# Patient Record
Sex: Female | Born: 1965
Health system: Southern US, Community
[De-identification: ages and names within clinical notes are randomized; demographics above are authoritative.]

## PROBLEM LIST (undated history)

## (undated) DIAGNOSIS — F419 Anxiety disorder, unspecified: Secondary | ICD-10-CM

## (undated) DIAGNOSIS — R7303 Prediabetes: Secondary | ICD-10-CM

## (undated) DIAGNOSIS — I10 Essential (primary) hypertension: Secondary | ICD-10-CM

## (undated) HISTORY — PX: EYE SURGERY: SHX253

---

## 2005-12-19 ENCOUNTER — Ambulatory Visit: Payer: Self-pay | Admitting: Ophthalmology

## 2006-01-25 ENCOUNTER — Ambulatory Visit: Payer: Self-pay | Admitting: Ophthalmology

## 2012-03-23 ENCOUNTER — Ambulatory Visit: Payer: Self-pay | Admitting: Physician Assistant

## 2013-05-22 ENCOUNTER — Ambulatory Visit: Payer: Self-pay | Admitting: Physician Assistant

## 2014-05-28 ENCOUNTER — Ambulatory Visit: Payer: Self-pay | Admitting: Physician Assistant

## 2014-06-05 ENCOUNTER — Ambulatory Visit: Payer: Self-pay | Admitting: Physician Assistant

## 2015-03-01 ENCOUNTER — Encounter: Payer: Self-pay | Admitting: Emergency Medicine

## 2015-03-01 ENCOUNTER — Emergency Department: Payer: No Typology Code available for payment source

## 2015-03-01 DIAGNOSIS — Y998 Other external cause status: Secondary | ICD-10-CM | POA: Diagnosis not present

## 2015-03-01 DIAGNOSIS — S6992XA Unspecified injury of left wrist, hand and finger(s), initial encounter: Secondary | ICD-10-CM | POA: Diagnosis not present

## 2015-03-01 DIAGNOSIS — Z79899 Other long term (current) drug therapy: Secondary | ICD-10-CM | POA: Diagnosis not present

## 2015-03-01 DIAGNOSIS — S2001XA Contusion of right breast, initial encounter: Secondary | ICD-10-CM | POA: Diagnosis not present

## 2015-03-01 DIAGNOSIS — S299XXA Unspecified injury of thorax, initial encounter: Secondary | ICD-10-CM | POA: Diagnosis present

## 2015-03-01 DIAGNOSIS — Y9389 Activity, other specified: Secondary | ICD-10-CM | POA: Diagnosis not present

## 2015-03-01 DIAGNOSIS — Y92410 Unspecified street and highway as the place of occurrence of the external cause: Secondary | ICD-10-CM | POA: Insufficient documentation

## 2015-03-01 DIAGNOSIS — I10 Essential (primary) hypertension: Secondary | ICD-10-CM | POA: Diagnosis not present
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
## 2015-03-02 ENCOUNTER — Emergency Department
Admission: EM | Admit: 2015-03-02 | Discharge: 2015-03-02 | Disposition: A | Discharge status: Home / Self Care | Payer: No Typology Code available for payment source | Attending: Student | Admitting: Student

## 2015-03-02 DIAGNOSIS — S2001XA Contusion of right breast, initial encounter: Secondary | ICD-10-CM

## 2015-03-02 DIAGNOSIS — R0789 Other chest pain: Secondary | ICD-10-CM

## 2015-03-02 HISTORY — DX: Essential (primary) hypertension: I10

## 2015-03-02 HISTORY — DX: Anxiety disorder, unspecified: F41.9
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
## 2015-05-20 ENCOUNTER — Other Ambulatory Visit: Payer: Self-pay | Admitting: Physician Assistant

## 2015-05-20 DIAGNOSIS — Z1231 Encounter for screening mammogram for malignant neoplasm of breast: Secondary | ICD-10-CM

## 2015-05-28 ENCOUNTER — Ambulatory Visit
Admission: RE | Admit: 2015-05-28 | Discharge: 2015-05-28 | Disposition: A | Discharge status: Home / Self Care | Payer: BLUE CROSS/BLUE SHIELD | Source: Ambulatory Visit | Attending: Physician Assistant | Admitting: Physician Assistant

## 2015-05-28 DIAGNOSIS — Z1231 Encounter for screening mammogram for malignant neoplasm of breast: Secondary | ICD-10-CM | POA: Insufficient documentation

## 2016-05-18 ENCOUNTER — Other Ambulatory Visit: Payer: Self-pay | Admitting: Physician Assistant

## 2016-05-18 DIAGNOSIS — Z1231 Encounter for screening mammogram for malignant neoplasm of breast: Secondary | ICD-10-CM

## 2016-06-08 ENCOUNTER — Ambulatory Visit: Payer: No Typology Code available for payment source

## 2016-06-29 ENCOUNTER — Ambulatory Visit
Admission: RE | Admit: 2016-06-29 | Discharge: 2016-06-29 | Disposition: A | Discharge status: Home / Self Care | Payer: BLUE CROSS/BLUE SHIELD | Source: Ambulatory Visit | Attending: Physician Assistant | Admitting: Physician Assistant

## 2016-06-29 DIAGNOSIS — Z1231 Encounter for screening mammogram for malignant neoplasm of breast: Secondary | ICD-10-CM | POA: Insufficient documentation

## 2016-07-04 ENCOUNTER — Other Ambulatory Visit: Payer: Self-pay | Admitting: Physician Assistant

## 2016-07-04 DIAGNOSIS — N6489 Other specified disorders of breast: Secondary | ICD-10-CM

## 2016-07-21 ENCOUNTER — Ambulatory Visit
Admission: RE | Admit: 2016-07-21 | Discharge: 2016-07-21 | Disposition: A | Discharge status: Home / Self Care | Payer: BLUE CROSS/BLUE SHIELD | Source: Ambulatory Visit | Attending: Physician Assistant | Admitting: Physician Assistant

## 2016-07-21 DIAGNOSIS — N6489 Other specified disorders of breast: Secondary | ICD-10-CM

## 2017-05-17 DIAGNOSIS — Z0000 Encounter for general adult medical examination without abnormal findings: Secondary | ICD-10-CM | POA: Diagnosis not present

## 2017-05-17 DIAGNOSIS — I10 Essential (primary) hypertension: Secondary | ICD-10-CM | POA: Diagnosis not present

## 2017-05-24 DIAGNOSIS — Z0000 Encounter for general adult medical examination without abnormal findings: Secondary | ICD-10-CM | POA: Diagnosis not present

## 2017-05-24 DIAGNOSIS — I10 Essential (primary) hypertension: Secondary | ICD-10-CM | POA: Diagnosis not present

## 2017-06-06 DIAGNOSIS — Z719 Counseling, unspecified: Secondary | ICD-10-CM | POA: Diagnosis not present

## 2017-11-24 DIAGNOSIS — R7303 Prediabetes: Secondary | ICD-10-CM | POA: Diagnosis not present

## 2017-11-24 DIAGNOSIS — I10 Essential (primary) hypertension: Secondary | ICD-10-CM | POA: Diagnosis not present

## 2017-12-29 ENCOUNTER — Other Ambulatory Visit: Payer: Self-pay | Admitting: Physician Assistant

## 2017-12-29 DIAGNOSIS — Z1239 Encounter for other screening for malignant neoplasm of breast: Secondary | ICD-10-CM

## 2017-12-29 DIAGNOSIS — Z1231 Encounter for screening mammogram for malignant neoplasm of breast: Secondary | ICD-10-CM

## 2019-06-05 ENCOUNTER — Ambulatory Visit: Payer: Self-pay | Admitting: Psychiatry

## 2019-06-12 ENCOUNTER — Other Ambulatory Visit: Payer: Self-pay

## 2019-06-12 ENCOUNTER — Ambulatory Visit (INDEPENDENT_AMBULATORY_CARE_PROVIDER_SITE_OTHER): Payer: 59 | Admitting: Psychiatry

## 2019-06-12 ENCOUNTER — Encounter: Payer: Self-pay | Admitting: Psychiatry

## 2019-06-12 DIAGNOSIS — I10 Essential (primary) hypertension: Secondary | ICD-10-CM | POA: Insufficient documentation

## 2019-06-12 DIAGNOSIS — F419 Anxiety disorder, unspecified: Secondary | ICD-10-CM | POA: Diagnosis not present

## 2019-06-12 DIAGNOSIS — G43909 Migraine, unspecified, not intractable, without status migrainosus: Secondary | ICD-10-CM | POA: Insufficient documentation

## 2019-06-12 DIAGNOSIS — F331 Major depressive disorder, recurrent, moderate: Secondary | ICD-10-CM | POA: Diagnosis not present

## 2019-06-12 MED ORDER — BUPROPION HCL ER (XL) 150 MG PO TB24: 150.0000 mg | ORAL_TABLET | ORAL | 1 refills | Status: DC

## 2019-06-12 NOTE — Progress Notes (Signed)
Psychiatric Initial Adult Assessment   I connected with  Zoe Bastaula D Hehir on 06/12/19 by a video enabled telemedicine application and verified that I am speaking with the correct person using two identifiers.   I discussed the limitations of evaluation and management by telemedicine. The patient expressed understanding and agreed to proceed.    Patient Identification: Zoe Koch MRN:  [id number] Date of Evaluation:  06/12/2019   Referral Source:  Phil DoppM. McLaughlin, PA, [facility]  Chief Complaint:   Chief Complaint    Establish Care; Anxiety     Visit Diagnosis:    ICD-10-CM   1. Moderate episode of recurrent major depressive disorder (HCC)  F33.1 buPROPion (WELLBUTRIN XL) 150 MG 24 hr tablet  2. Anxiety  F41.9     History of Present Illness: This is a 53 year old female with history of anxiety now seen for depressive symptoms.  Patient reported being under stress due to her 51 year old son leaving their home and staying with friends since last week.  Patient reported that she is worried about him however she has a tracker on his phone during which she knows his whereabouts.  She spoke about how her 51 year old son has made impulsive decisions in the past resulting in legal implications.  She stated that her son is currently supposed to be doing community service for 2 speeding tickets.  Patient reported that she has always had low self-esteem.  Her excessive weight.  She spoke about being in a relationship more than 20 years ago in which her partner's ex committed suicide.  Patient stated that she feels about that.  She also reported being in several unhealthy relationships in the past.  He stated that she always ends a in constrained relationships due to her poor choice of partners.  Patient stated that she is able to function at work, works as a Community education officercar salesman.  She feels her work is like a good Therapist, artdistraction.  However at home she feels depressed and tearful.  She cries easily and  frequently.  She has hard time focusing on tasks at home.  She is able to fall asleep however wakes up early in the morning.  She is trying hard to keep her appetite under control as she wants to lose weight.  She has had thoughts of why she is alive because her mind however she has never thought of hurting herself.  She denied any suicide attempts in the past.  She denied any manic or psychotic symptoms.  She has been on phentermine on and off for weight loss for last few years which she feels has helped some but not significantly.  She has been prescribed Xanax by her PCP for the past few years which she finds to be helpful.  She usually takes 1 tablet/day sometimes needs food and 1 tablet.  She is never taken any other medications for depression or anxiety. She denied excessive consumption of alcohol or use of illicit drugs.  Patient reported having friends that are supportive.  Associated Signs/Symptoms: Depression Symptoms:  See HPI (Hypo) Manic Symptoms:  denied Anxiety Symptoms:  See HPI Psychotic Symptoms:  denied PTSD Symptoms: Negative  Past Psychiatric History: Anxiety  Previous Psychotropic Medications: Yes   Substance Abuse History in the last 12 months:  No.  Consequences of Substance Abuse: Negative  Past Medical History:  Past Medical History:  Diagnosis Date  . Anxiety   . Hypertension     Past Surgical History:  Procedure Laterality Date  . EYE SURGERY  Family Psychiatric History: denied  Family History: History reviewed. No pertinent family history.  Social History:   Social History   Socioeconomic History  . Marital status: Single    Spouse name: Not on file  . Number of children: 1  . Years of education: Not on file  . Highest education level: Bachelor's degree (e.g., BA, AB, BS)  Occupational History  . Not on file  Social Needs  . Financial resource strain: Not hard at all  . Food insecurity    Worry: Never true    Inability: Never  true  . Transportation needs    Medical: No    Non-medical: No  Tobacco Use  . Smoking status: Never Smoker  . Smokeless tobacco: Never Used  Substance and Sexual Activity  . Alcohol use: Yes    Comment: occasional  . Drug use: No  . Sexual activity: Not Currently  Lifestyle  . Physical activity    Days per week: 0 days    Minutes per session: 0 min  . Stress: Not on file  Relationships  . Social Musician on phone: Not on file    Gets together: Not on file    Attends religious service: Never    Active member of club or organization: No    Attends meetings of clubs or organizations: Never    Relationship status: Never married  Other Topics Concern  . Not on file  Social History Narrative  . 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  Allergies:   Allergies  Allergen Reactions  . Sulfa Antibiotics Hives    Metabolic Disorder Labs: No results found for: HGBA1C, MPG No results found for: PROLACTIN No results found for: CHOL, TRIG, HDL, CHOLHDL, VLDL, LDLCALC No results found for: TSH  Therapeutic Level Labs: No results found for: LITHIUM No results found for: CBMZ No results found for: VALPROATE  Current Medications: Current Outpatient Medications  Medication Sig Dispense Refill  . ALPRAZolam (XANAX) 0.25 MG tablet Take 0.25 mg by mouth every morning.    . Cholecalciferol (VITAMIN D3 PO) Take by mouth.    . losartan-hydrochlorothiazide (HYZAAR) 100-25 MG per tablet Take 1 tablet by mouth daily.    . Magnesium 200 MG TABS Take by mouth.    . metFORMIN (GLUCOPHAGE-XR) 500 MG 24 hr tablet TAKE 1 TABLET(500 MG) BY MOUTH DAILY WITH DINNER    . multivitamin-iron-minerals-folic acid (CENTRUM) chewable tablet Chew 1 tablet by mouth daily.    . phentermine 37.5 MG capsule Take 37.5 mg by mouth every morning.    . Probiotic Product (SM ACIDOPHILUS) CAPS Take by mouth.    Marland Kitchen buPROPion (WELLBUTRIN XL) 150 MG 24 hr tablet Take  1 tablet (150 mg total) by mouth every morning. 30 tablet 1   No current facility-administered medications for this visit.     Musculoskeletal: Strength & Muscle Tone: unable to assess due to telemed visit Gait & Station: unable to assess due to telemed visit Patient leans: unable to assess due to telemed visit  Psychiatric Specialty Exam: ROS  There were no vitals taken for this visit.There is no height or weight on file to calculate BMI.  General Appearance: Fairly Groomed  Eye Contact:  Good  Speech:  Clear and Coherent and Normal Rate  Volume:  Normal  Mood:  Depressed  Affect:  Depressed and Tearful  Thought Process:  Goal Directed, Linear and Descriptions of Associations: Intact  Orientation:  Full (Time, Place, and Person)  Thought Content:  Logical  Suicidal Thoughts:  No  Homicidal Thoughts:  No  Memory:  Recent;   Good Remote;   Good  Judgement:  Fair  Insight:  Fair  Psychomotor Activity:  Normal  Concentration:  Concentration: Good and Attention Span: Good  Recall:  Good  Fund of Knowledge:Good  Language: Good  Akathisia:  Negative  Handed:  Right  AIMS (if indicated):  Not done  Assets:  Communication Skills Desire for Improvement Financial Resources/Insurance Social Support Talents/Skills Transportation Vocational/Educational  ADL's:  Intact  Cognition: WNL  Sleep:  Fair   Assessment and Plan: 53 year old female seen for depressive symptoms and anxiety in the context of low self-esteem and stress due to 60 year old son.  She is never taken any antidepressants and is willing to try Wellbutrin for depressive symptoms.  She would like to continue as needed Xanax for anxiety.  1. Moderate episode of recurrent major depressive disorder (HCC)  - Start buPROPion (WELLBUTRIN XL) 150 MG 24 hr tablet; Take 1 tablet (150 mg total) by mouth every morning.  Dispense: 30 tablet; Refill: 1  2. Anxiety - Continue as needed Xanax.  Follow-up in 6  weeks.   [place] Crane, MD 10/28/20201:18 PM

## 2019-07-25 ENCOUNTER — Ambulatory Visit (INDEPENDENT_AMBULATORY_CARE_PROVIDER_SITE_OTHER): Payer: 59 | Admitting: Psychiatry

## 2019-07-25 ENCOUNTER — Other Ambulatory Visit: Payer: Self-pay

## 2019-07-25 ENCOUNTER — Encounter: Payer: Self-pay | Admitting: Psychiatry

## 2019-07-25 DIAGNOSIS — F3342 Major depressive disorder, recurrent, in full remission: Secondary | ICD-10-CM

## 2019-07-25 DIAGNOSIS — F331 Major depressive disorder, recurrent, moderate: Secondary | ICD-10-CM | POA: Diagnosis not present

## 2019-07-25 DIAGNOSIS — F419 Anxiety disorder, unspecified: Secondary | ICD-10-CM

## 2019-07-25 MED ORDER — BUPROPION HCL ER (XL) 150 MG PO TB24: 150.0000 mg | ORAL_TABLET | ORAL | 1 refills | Status: DC

## 2019-07-25 NOTE — Progress Notes (Signed)
BH MD OP Progress Note  I connected with  Zoe Koch on 07/25/19 by a video enabled telemedicine application and verified that I am speaking with the correct person using two identifiers.   I discussed the limitations of evaluation and management by telemedicine. The patient expressed understanding and agreed to proceed.   07/25/2019 8:35 AM Zoe Koch  MRN:  [id number]  Chief Complaint: " I am doing well."  HPI: Patient reported that she has been doing well since she started taking Wellbutrin.  She feels it has helped her depressive symptoms and energy levels.  She feels more productive at work.  Overall she is doing better.  She informed that her son is still not at home.  He did not even come home for Thanksgiving.  They have been in touch over the phone.  She is worried that her son is not doing any of her schoolwork and attending the online classes.  He asked decided to start working full-time.  She just wants him to make sensible decisions and stay away from trouble. She denied any issues or concerns at this time.  Visit Diagnosis:    ICD-10-CM   1. MDD (major depressive disorder), recurrent, in full remission (HCC)  F33.42   2. Anxiety  F41.9     Past Psychiatric History: Depression, anxiety  Past Medical History:  Past Medical History:  Diagnosis Date  . Anxiety   . Hypertension     Past Surgical History:  Procedure Laterality Date  . EYE SURGERY      Family Psychiatric History: Denied  Family History: No family history on file.  Social History:  Social History   Socioeconomic History  . Marital status: Single    Spouse name: Not on file  . Number of children: 1  . Years of education: Not on file  . Highest education level: Bachelor's degree (e.g., BA, AB, BS)  Occupational History  . Not on file  Tobacco Use  . Smoking status: Never Smoker  . Smokeless tobacco: Never Used  Substance and Sexual Activity  . Alcohol use: Yes    Comment: occasional   . Drug use: No  . Sexual activity: Not Currently  Other Topics Concern  . Not on file  Social History Narrative  . Not on file   Social Determinants of Health   Financial Resource Strain: Low Risk   . Difficulty of Paying Living Expenses: Not hard at all  Food Insecurity: No Food Insecurity  . Worried About Programme researcher, broadcasting/film/video in the Last Year: Never true  . Ran Out of Food in the Last Year: Never true  Transportation Needs: No Transportation Needs  . Lack of Transportation (Medical): No  . Lack of Transportation (Non-Medical): No  Physical Activity: Inactive  . Days of Exercise per Week: 0 days  . Minutes of Exercise per Session: 0 min  Stress:   . Feeling of Stress : Not on file  Social Connections: Unknown  . Frequency of Communication with Friends and Family: Not on file  . Frequency of Social Gatherings with Friends and Family: Not on file  . Attends Religious Services: Never  . Active Member of Clubs or Organizations: No  . Attends Banker Meetings: Never  . Marital Status: Never married    Allergies:  Allergies  Allergen Reactions  . Sulfa Antibiotics Hives    Metabolic Disorder Labs: No results found for: HGBA1C, MPG No results found for: PROLACTIN No results found for: CHOL,  TRIG, HDL, CHOLHDL, VLDL, LDLCALC No results found for: TSH  Therapeutic Level Labs: No results found for: LITHIUM No results found for: VALPROATE No components found for:  CBMZ  Current Medications: Current Outpatient Medications  Medication Sig Dispense Refill  . ALPRAZolam (XANAX) 0.25 MG tablet Take 0.25 mg by mouth every morning.    Marland Kitchen buPROPion (WELLBUTRIN XL) 150 MG 24 hr tablet Take 1 tablet (150 mg total) by mouth every morning. 30 tablet 1  . Cholecalciferol (VITAMIN D3 PO) Take by mouth.    . losartan-hydrochlorothiazide (HYZAAR) 100-25 MG per tablet Take 1 tablet by mouth daily.    . Magnesium 200 MG TABS Take by mouth.    . metFORMIN (GLUCOPHAGE-XR) 500  MG 24 hr tablet TAKE 1 TABLET(500 MG) BY MOUTH DAILY WITH DINNER    . multivitamin-iron-minerals-folic acid (CENTRUM) chewable tablet Chew 1 tablet by mouth daily.    . phentermine 37.5 MG capsule Take 37.5 mg by mouth every morning.    . Probiotic Product (SM ACIDOPHILUS) CAPS Take by mouth.     No current facility-administered medications for this visit.     Musculoskeletal: Strength & Muscle Tone: unable to assess due to telemed visit Gait & Station: unable to assess due to telemed visit Patient leans: unable to assess due to telemed visit   Psychiatric Specialty Exam: Review of Systems  There were no vitals taken for this visit.There is no height or weight on file to calculate BMI.  General Appearance: Well Groomed  Eye Contact:  Good  Speech:  Clear and Coherent and Normal Rate  Volume:  Normal  Mood:  Euthymic  Affect:  Congruent  Thought Process:  Goal Directed, Linear and Descriptions of Associations: Intact  Orientation:  Full (Time, Place, and Person)  Thought Content: Logical   Suicidal Thoughts:  No  Homicidal Thoughts:  No  Memory:  Recent;   Good Remote;   Good  Judgement:  Good  Insight:  Good  Psychomotor Activity:  Normal  Concentration:  Concentration: Good and Attention Span: Good  Recall:  Good  Fund of Knowledge: Good  Language: Good  Akathisia:  Negative  Handed:  Right  AIMS (if indicated): not done  Assets:  Communication Skills Desire for Improvement Financial Resources/Insurance Housing Social Support  ADL's:  Intact  Cognition: WNL  Sleep:  Good     Assessment and Plan: Patient reported doing well on Wellbutrin XL 150 mg every morning.  She still worried about her son but is trying to support him as much as she can.  1. MDD (major depressive disorder), recurrent, in full remission ([place])  - buPROPion (WELLBUTRIN XL) 150 MG 24 hr tablet; Take 1 tablet (150 mg total) by mouth every morning.  Dispense: 30 tablet; Refill: 1  2.  Anxiety - Continue PRN Xanax.  F/up in 2 months.     [place] Crane, MD 07/25/2019, 8:35 AM

## 2019-09-19 ENCOUNTER — Ambulatory Visit (INDEPENDENT_AMBULATORY_CARE_PROVIDER_SITE_OTHER): Payer: 59 | Admitting: Psychology

## 2019-09-19 DIAGNOSIS — F411 Generalized anxiety disorder: Secondary | ICD-10-CM | POA: Diagnosis not present

## 2019-09-26 ENCOUNTER — Ambulatory Visit (INDEPENDENT_AMBULATORY_CARE_PROVIDER_SITE_OTHER): Payer: 59 | Admitting: Psychology

## 2019-09-26 DIAGNOSIS — F411 Generalized anxiety disorder: Secondary | ICD-10-CM

## 2019-09-27 ENCOUNTER — Ambulatory Visit (INDEPENDENT_AMBULATORY_CARE_PROVIDER_SITE_OTHER): Payer: 59 | Admitting: Psychiatry

## 2019-09-27 ENCOUNTER — Other Ambulatory Visit: Payer: Self-pay

## 2019-09-27 ENCOUNTER — Encounter: Payer: Self-pay | Admitting: Psychiatry

## 2019-09-27 DIAGNOSIS — F3342 Major depressive disorder, recurrent, in full remission: Secondary | ICD-10-CM | POA: Diagnosis not present

## 2019-09-27 DIAGNOSIS — F419 Anxiety disorder, unspecified: Secondary | ICD-10-CM | POA: Diagnosis not present

## 2019-09-27 MED ORDER — BUPROPION HCL ER (XL) 150 MG PO TB24: 150.0000 mg | ORAL_TABLET | ORAL | 1 refills | Status: DC

## 2019-09-27 NOTE — Progress Notes (Signed)
[facility] MD OP Progress Note  I connected with  Zoe Koch on 09/27/19 by a video enabled telemedicine application and verified that I am speaking with the correct person using two identifiers.   I discussed the limitations of evaluation and management by telemedicine. The patient expressed understanding and agreed to proceed.   09/27/2019 8:35 AM Zoe Koch  MRN:  [id number]  Chief Complaint: " I am doing well."  HPI: Patient reported that she is doing well. She has started psychotherapy and has seen the therapist for 2 sessions recently.  She feels that she is on the right track for now.  She informed that her son came back home right for Christmas and has been living with her since then.  It has been a big relief to have been at home for her.  She informed that he is still not attending his online virtual classes.  He has only 1 more class left and if he completes the credits for that he would be able to graduate high school in summer.  She stated that she really wants him to have a high school diploma.  He is working more than 40 hours/week currently and is being responsible with his own finances. She has been sleeping well and denied any other acute issues or concerns.   Visit Diagnosis:    ICD-10-CM   1. MDD (major depressive disorder), recurrent, in full remission ([place])  F33.42   2. Anxiety  F41.9     Past Psychiatric History: Depression, anxiety  Past Medical History:  Past Medical History:  Diagnosis Date  . Anxiety   . Hypertension     Past Surgical History:  Procedure Laterality Date  . EYE SURGERY      Family Psychiatric History: Denied  Family History: No family history on file.  Social History:  Social History   Socioeconomic History  . Marital status: Single    Spouse name: Not on file  . Number of children: 1  . Years of education: Not on file  . Highest education level: Bachelor's degree (e.g., BA, AB, BS)  Occupational History  . Not on file   Tobacco Use  . Smoking status: Never Smoker  . Smokeless tobacco: Never Used  Substance and Sexual Activity  . Alcohol use: Yes    Comment: occasional  . Drug use: No  . Sexual activity: Not Currently  Other Topics Concern  . Not on file  Social History Narrative  . Not on file   Social Determinants of Health   Financial Resource Strain: Low Risk   . Difficulty of Paying Living Expenses: Not hard at all  Food Insecurity: No Food Insecurity  . Worried About Charity fundraiser in the Last Year: Never true  . Ran Out of Food in the Last Year: Never true  Transportation Needs: No Transportation Needs  . Lack of Transportation (Medical): No  . Lack of Transportation (Non-Medical): No  Physical Activity: Inactive  . Days of Exercise per Week: 0 days  . Minutes of Exercise per Session: 0 min  Stress:   . Feeling of Stress : Not on file  Social Connections: Unknown  . Frequency of Communication with Friends and Family: Not on file  . Frequency of Social Gatherings with Friends and Family: Not on file  . Attends Religious Services: Never  . Active Member of Clubs or Organizations: No  . Attends Archivist Meetings: Never  . Marital Status: Never married  Allergies:  Allergies  Allergen Reactions  . Sulfa Antibiotics Hives    Metabolic Disorder Labs: No results found for: HGBA1C, MPG No results found for: PROLACTIN No results found for: CHOL, TRIG, HDL, CHOLHDL, VLDL, LDLCALC No results found for: TSH  Therapeutic Level Labs: No results found for: LITHIUM No results found for: VALPROATE No components found for:  CBMZ  Current Medications: Current Outpatient Medications  Medication Sig Dispense Refill  . ALPRAZolam (XANAX) 0.25 MG tablet Take 0.25 mg by mouth every morning.    Marland Kitchen buPROPion (WELLBUTRIN XL) 150 MG 24 hr tablet Take 1 tablet (150 mg total) by mouth every morning. 30 tablet 1  . Cholecalciferol (VITAMIN D3 PO) Take by mouth.    .  losartan-hydrochlorothiazide (HYZAAR) 100-25 MG per tablet Take 1 tablet by mouth daily.    . Magnesium 200 MG TABS Take by mouth.    . metFORMIN (GLUCOPHAGE-XR) 500 MG 24 hr tablet TAKE 1 TABLET(500 MG) BY MOUTH DAILY WITH DINNER    . multivitamin-iron-minerals-folic acid (CENTRUM) chewable tablet Chew 1 tablet by mouth daily.    . phentermine 37.5 MG capsule Take 37.5 mg by mouth every morning.    . Probiotic Product (SM ACIDOPHILUS) CAPS Take by mouth.     No current facility-administered medications for this visit.     Musculoskeletal: Strength & Muscle Tone: unable to assess due to telemed visit Gait & Station: unable to assess due to telemed visit Patient leans: unable to assess due to telemed visit   Psychiatric Specialty Exam: Review of Systems  There were no vitals taken for this visit.There is no height or weight on file to calculate BMI.  General Appearance: Fairly Groomed  Eye Contact:  Good  Speech:  Clear and Coherent and Normal Rate  Volume:  Normal  Mood:  Euthymic  Affect:  Congruent  Thought Process:  Goal Directed, Linear and Descriptions of Associations: Intact  Orientation:  Full (Time, Place, and Person)  Thought Content: Logical   Suicidal Thoughts:  No  Homicidal Thoughts:  No  Memory:  Recent;   Good Remote;   Good  Judgement:  Good  Insight:  Good  Psychomotor Activity:  Normal  Concentration:  Concentration: Good and Attention Span: Good  Recall:  Good  Fund of Knowledge: Good  Language: Good  Akathisia:  Negative  Handed:  Right  AIMS (if indicated): not done  Assets:  Communication Skills Desire for Improvement Financial Resources/Insurance Housing Social Support  ADL's:  Intact  Cognition: WNL  Sleep:  Good     Assessment and Plan: Patient reported doing well on current regimen.  1. MDD (major depressive disorder), recurrent, in full remission ([facility])  -Continue buPROPion (WELLBUTRIN XL) 150 MG 24 hr tablet; Take 1 tablet (150 mg  total) by mouth every morning.  Dispense: 30 tablet; Refill: 1  2. Anxiety - Continue PRN Xanax.  F/up in 3 months.     Zena Amos, MD 09/27/2019, 8:35 AM

## 2019-10-03 ENCOUNTER — Ambulatory Visit: Payer: 59 | Admitting: Psychology

## 2019-10-10 ENCOUNTER — Ambulatory Visit (INDEPENDENT_AMBULATORY_CARE_PROVIDER_SITE_OTHER): Payer: 59 | Admitting: Psychology

## 2019-10-10 DIAGNOSIS — F411 Generalized anxiety disorder: Secondary | ICD-10-CM | POA: Diagnosis not present

## 2019-11-12 ENCOUNTER — Other Ambulatory Visit: Payer: Self-pay | Admitting: Physician Assistant

## 2019-11-12 DIAGNOSIS — N6489 Other specified disorders of breast: Secondary | ICD-10-CM

## 2019-11-27 ENCOUNTER — Ambulatory Visit
Admission: RE | Admit: 2019-11-27 | Discharge: 2019-11-27 | Disposition: A | Discharge status: Home / Self Care | Payer: 59 | Source: Ambulatory Visit | Attending: Physician Assistant | Admitting: Physician Assistant

## 2019-11-27 ENCOUNTER — Ambulatory Visit (INDEPENDENT_AMBULATORY_CARE_PROVIDER_SITE_OTHER): Payer: 59 | Admitting: Psychology

## 2019-11-27 DIAGNOSIS — N6489 Other specified disorders of breast: Secondary | ICD-10-CM

## 2019-11-27 DIAGNOSIS — F411 Generalized anxiety disorder: Secondary | ICD-10-CM | POA: Diagnosis not present

## 2019-12-19 ENCOUNTER — Telehealth (INDEPENDENT_AMBULATORY_CARE_PROVIDER_SITE_OTHER): Payer: 59 | Admitting: Psychiatry

## 2019-12-19 ENCOUNTER — Encounter: Payer: Self-pay | Admitting: Psychiatry

## 2019-12-19 ENCOUNTER — Other Ambulatory Visit: Payer: Self-pay

## 2019-12-19 DIAGNOSIS — F3342 Major depressive disorder, recurrent, in full remission: Secondary | ICD-10-CM

## 2019-12-19 DIAGNOSIS — F419 Anxiety disorder, unspecified: Secondary | ICD-10-CM | POA: Diagnosis not present

## 2019-12-19 MED ORDER — BUPROPION HCL ER (XL) 150 MG PO TB24: 150.0000 mg | ORAL_TABLET | ORAL | 1 refills | Status: DC

## 2019-12-19 NOTE — Progress Notes (Signed)
[facility] MD OP Progress Note  I connected with  Zoe Koch on 12/19/19 by a video enabled telemedicine application and verified that I am speaking with the correct person using two identifiers.   I discussed the limitations of evaluation and management by telemedicine. The patient expressed understanding and agreed to proceed.   12/19/2019 8:54 AM Zoe Koch  MRN:  [id number]  Chief Complaint: " I am doing well."  HPI: Patient reported that she is doing well. She informed her mood has been great with help of Wellbutrin. She informed that her adoptive son is doing very well too. He has been attending his classes and is going to graduate form high school end of this month. He is still working full time. Pt stated that she is very relieved of the stress she had to face with her son last year. She denied any acute issues or concerns at this time.   Visit Diagnosis:    ICD-10-CM   1. MDD (major depressive disorder), recurrent, in full remission ([place])  F33.42   2. Anxiety  F41.9     Past Psychiatric History: Depression, anxiety  Past Medical History:  Past Medical History:  Diagnosis Date  . Anxiety   . Hypertension     Past Surgical History:  Procedure Laterality Date  . EYE SURGERY      Family Psychiatric History: Denied  Family History:  Family History  Problem Relation Age of Onset  . Breast cancer Neg Hx     Social History:  Social History   Socioeconomic History  . Marital status: Single    Spouse name: Not on file  . Number of children: 1  . Years of education: Not on file  . Highest education level: Bachelor's degree (e.g., BA, AB, BS)  Occupational History  . Not on file  Tobacco Use  . Smoking status: Never Smoker  . Smokeless tobacco: Never Used  Substance and Sexual Activity  . Alcohol use: Yes    Comment: occasional  . Drug use: No  . Sexual activity: Not Currently  Other Topics Concern  . Not on file  Social History Narrative  . Not on file    Social Determinants of Health   Financial Resource Strain: Low Risk   . Difficulty of Paying Living Expenses: Not hard at all  Food Insecurity: No Food Insecurity  . Worried About Charity fundraiser in the Last Year: Never true  . Ran Out of Food in the Last Year: Never true  Transportation Needs: No Transportation Needs  . Lack of Transportation (Medical): No  . Lack of Transportation (Non-Medical): No  Physical Activity: Inactive  . Days of Exercise per Week: 0 days  . Minutes of Exercise per Session: 0 min  Stress:   . Feeling of Stress :   Social Connections: Unknown  . Frequency of Communication with Friends and Family: Not on file  . Frequency of Social Gatherings with Friends and Family: Not on file  . Attends Religious Services: Never  . Active Member of Clubs or Organizations: No  . Attends Archivist Meetings: Never  . Marital Status: Never married    Allergies:  Allergies  Allergen Reactions  . Sulfa Antibiotics Hives    Metabolic Disorder Labs: No results found for: HGBA1C, MPG No results found for: PROLACTIN No results found for: CHOL, TRIG, HDL, CHOLHDL, VLDL, LDLCALC No results found for: TSH  Therapeutic Level Labs: No results found for: LITHIUM No results found for:  VALPROATE No components found for:  CBMZ  Current Medications: Current Outpatient Medications  Medication Sig Dispense Refill  . ALPRAZolam (XANAX) 0.25 MG tablet Take 0.25 mg by mouth every morning.    Marland Kitchen buPROPion (WELLBUTRIN XL) 150 MG 24 hr tablet Take 1 tablet (150 mg total) by mouth every morning. 90 tablet 1  . Cholecalciferol (VITAMIN D3 PO) Take by mouth.    . losartan-hydrochlorothiazide (HYZAAR) 100-25 MG per tablet Take 1 tablet by mouth daily.    . Magnesium 200 MG TABS Take by mouth.    . metFORMIN (GLUCOPHAGE-XR) 500 MG 24 hr tablet TAKE 1 TABLET(500 MG) BY MOUTH DAILY WITH DINNER    . multivitamin-iron-minerals-folic acid (CENTRUM) chewable tablet Chew 1  tablet by mouth daily.    . phentermine 37.5 MG capsule Take 37.5 mg by mouth every morning.    . Probiotic Product (SM ACIDOPHILUS) CAPS Take by mouth.     No current facility-administered medications for this visit.     Musculoskeletal: Strength & Muscle Tone: unable to assess due to telemed visit Gait & Station: unable to assess due to telemed visit Patient leans: unable to assess due to telemed visit   Psychiatric Specialty Exam: Review of Systems  Last menstrual period 07/15/2016.There is no height or weight on file to calculate BMI.  General Appearance: Well Groomed  Eye Contact:  Good  Speech:  Clear and Coherent and Normal Rate  Volume:  Normal  Mood:  Euthymic  Affect:  Congruent  Thought Process:  Goal Directed, Linear and Descriptions of Associations: Intact  Orientation:  Full (Time, Place, and Person)  Thought Content: Logical   Suicidal Thoughts:  No  Homicidal Thoughts:  No  Memory:  Recent;   Good Remote;   Good  Judgement:  Good  Insight:  Good  Psychomotor Activity:  Normal  Concentration:  Concentration: Good and Attention Span: Good  Recall:  Good  Fund of Knowledge: Good  Language: Good  Akathisia:  Negative  Handed:  Right  AIMS (if indicated): not done  Assets:  Communication Skills Desire for Improvement Financial Resources/Insurance Housing Social Support  ADL's:  Intact  Cognition: WNL  Sleep:  Good     Assessment and Plan: Patient reported doing well on current regimen.  1. MDD (major depressive disorder), recurrent, in full remission ([facility])  -Continue buPROPion (WELLBUTRIN XL) 150 MG 24 hr tablet; Take 1 tablet (150 mg total) by mouth every morning.  Dispense: 30 tablet; Refill: 1  2. Anxiety - Continue PRN Xanax.  F/up in 3 months.     Zena Amos, MD 12/19/2019, 8:54 AM

## 2020-01-02 ENCOUNTER — Ambulatory Visit: Payer: 59 | Admitting: Psychology

## 2020-01-29 ENCOUNTER — Ambulatory Visit (INDEPENDENT_AMBULATORY_CARE_PROVIDER_SITE_OTHER): Payer: 59 | Admitting: Psychology

## 2020-01-29 DIAGNOSIS — F411 Generalized anxiety disorder: Secondary | ICD-10-CM

## 2020-02-13 ENCOUNTER — Telehealth: Payer: 59 | Admitting: Psychiatry

## 2020-02-13 ENCOUNTER — Other Ambulatory Visit: Payer: Self-pay

## 2020-02-13 ENCOUNTER — Encounter (HOSPITAL_COMMUNITY): Payer: Self-pay | Admitting: Psychiatry

## 2020-02-13 ENCOUNTER — Telehealth (INDEPENDENT_AMBULATORY_CARE_PROVIDER_SITE_OTHER): Payer: 59 | Admitting: Psychiatry

## 2020-02-13 DIAGNOSIS — F3342 Major depressive disorder, recurrent, in full remission: Secondary | ICD-10-CM | POA: Diagnosis not present

## 2020-02-13 DIAGNOSIS — F419 Anxiety disorder, unspecified: Secondary | ICD-10-CM

## 2020-02-13 MED ORDER — BUPROPION HCL ER (XL) 150 MG PO TB24: 150.0000 mg | ORAL_TABLET | ORAL | 1 refills | Status: AC

## 2020-02-13 NOTE — Progress Notes (Signed)
[facility] OP Progress Note  Virtual Visit via Video Note  I connected with Zoe Koch on 02/13/20 at  8:30 AM EDT by a video enabled telemedicine application and verified that I am speaking with the correct person using two identifiers.  Location: Patient: Home Provider: Clinic   I discussed the limitations of evaluation and management by telemedicine and the availability of in person appointments. The patient expressed understanding and agreed to proceed.  I provided 13 minutes of non-face-to-face time during this encounter.     02/13/2020 8:45 AM THOMASINA HOUSLEY  MRN:  [id number]  Chief Complaint: " Everything is going well."  HPI: Patient reported things going well.  She informed that her son graduate from high school and she is very happy about that.  She stated that she feels very relieved and feels like she deserves as she put a lot of effort in making sure he graduates from high school. She informed that he still living with her and has a full-time job now.  She informed that she is doing well at her work and everything is progressing well for them.  She denies any acute issues or concerns at this time.  Visit Diagnosis:    ICD-10-CM   1. MDD (major depressive disorder), recurrent, in full remission ([facility])  F33.42   2. Anxiety  F41.9     Past Psychiatric History: Depression, anxiety  Past Medical History:  Past Medical History:  Diagnosis Date  . Anxiety   . Hypertension     Past Surgical History:  Procedure Laterality Date  . EYE SURGERY      Family Psychiatric History: Denied  Family History:  Family History  Problem Relation Age of Onset  . Breast cancer Neg Hx     Social History:  Social History   Socioeconomic History  . Marital status: Single    Spouse name: Not on file  . Number of children: 1  . Years of education: Not on file  . Highest education level: Bachelor's degree (e.g., BA, AB, BS)  Occupational History  . Not on file  Tobacco Use  .  Smoking status: Never Smoker  . Smokeless tobacco: Never Used  Vaping Use  . Vaping Use: Never used  Substance and Sexual Activity  . Alcohol use: Yes    Comment: occasional  . Drug use: No  . Sexual activity: Not Currently  Other Topics Concern  . Not on file  Social History Narrative  . Not on file   Social Determinants of Health   Financial Resource Strain: Low Risk   . Difficulty of Paying Living Expenses: Not hard at all  Food Insecurity: No Food Insecurity  . Worried About Programme researcher, broadcasting/film/video in the Last Year: Never true  . Ran Out of Food in the Last Year: Never true  Transportation Needs: No Transportation Needs  . Lack of Transportation (Medical): No  . Lack of Transportation (Non-Medical): No  Physical Activity: Inactive  . Days of Exercise per Week: 0 days  . Minutes of Exercise per Session: 0 min  Stress:   . Feeling of Stress :   Social Connections: Unknown  . Frequency of Communication with Friends and Family: Not on file  . Frequency of Social Gatherings with Friends and Family: Not on file  . Attends Religious Services: Never  . Active Member of Clubs or Organizations: No  . Attends Banker Meetings: Never  . Marital Status: Never married    Allergies:  Allergies  Allergen Reactions  . Sulfa Antibiotics Hives    Metabolic Disorder Labs: No results found for: HGBA1C, MPG No results found for: PROLACTIN No results found for: CHOL, TRIG, HDL, CHOLHDL, VLDL, LDLCALC No results found for: TSH  Therapeutic Level Labs: No results found for: LITHIUM No results found for: VALPROATE No components found for:  CBMZ  Current Medications: Current Outpatient Medications  Medication Sig Dispense Refill  . ALPRAZolam (XANAX) 0.25 MG tablet Take 0.25 mg by mouth every morning.    Marland Kitchen buPROPion (WELLBUTRIN XL) 150 MG 24 hr tablet Take 1 tablet (150 mg total) by mouth every morning. 90 tablet 1  . Cholecalciferol (VITAMIN D3 PO) Take by mouth.    .  losartan-hydrochlorothiazide (HYZAAR) 100-25 MG per tablet Take 1 tablet by mouth daily.    . Magnesium 200 MG TABS Take by mouth.    . metFORMIN (GLUCOPHAGE-XR) 500 MG 24 hr tablet TAKE 1 TABLET(500 MG) BY MOUTH DAILY WITH DINNER    . multivitamin-iron-minerals-folic acid (CENTRUM) chewable tablet Chew 1 tablet by mouth daily.    . phentermine 37.5 MG capsule Take 37.5 mg by mouth every morning.    . Probiotic Product (SM ACIDOPHILUS) CAPS Take by mouth.     No current facility-administered medications for this visit.     Musculoskeletal: Strength & Muscle Tone: unable to assess due to telemed visit Gait & Station: unable to assess due to telemed visit Patient leans: unable to assess due to telemed visit   Psychiatric Specialty Exam: Review of Systems  Last menstrual period 07/15/2016.There is no height or weight on file to calculate BMI.  General Appearance: Well Groomed  Eye Contact:  Good  Speech:  Clear and Coherent and Normal Rate  Volume:  Normal  Mood:  Euthymic  Affect:  Congruent  Thought Process:  Goal Directed, Linear and Descriptions of Associations: Intact  Orientation:  Full (Time, Place, and Person)  Thought Content: Logical   Suicidal Thoughts:  No  Homicidal Thoughts:  No  Memory:  Recent;   Good Remote;   Good  Judgement:  Good  Insight:  Good  Psychomotor Activity:  Normal  Concentration:  Concentration: Good and Attention Span: Good  Recall:  Good  Fund of Knowledge: Good  Language: Good  Akathisia:  Negative  Handed:  Right  AIMS (if indicated): not done  Assets:  Communication Skills Desire for Improvement Financial Resources/Insurance Housing Social Support  ADL's:  Intact  Cognition: WNL  Sleep:  Good     Assessment and Plan: Patient appears to be stable on her current regimen.  1. MDD (major depressive disorder), recurrent, in full remission ([facility])  -Continue buPROPion (WELLBUTRIN XL) 150 MG 24 hr tablet; Take 1 tablet (150 mg total)  by mouth every morning.  Dispense: 30 tablet; Refill: 1  2. Anxiety - Continue PRN Xanax.  F/up in 4 months.     Zena Amos, MD 02/13/2020, 8:45 AM

## 2020-03-11 ENCOUNTER — Ambulatory Visit (INDEPENDENT_AMBULATORY_CARE_PROVIDER_SITE_OTHER): Payer: 59 | Admitting: Psychology

## 2020-03-11 DIAGNOSIS — F411 Generalized anxiety disorder: Secondary | ICD-10-CM | POA: Diagnosis not present

## 2020-05-06 ENCOUNTER — Ambulatory Visit (INDEPENDENT_AMBULATORY_CARE_PROVIDER_SITE_OTHER): Payer: 59 | Admitting: Psychology

## 2020-05-06 DIAGNOSIS — F411 Generalized anxiety disorder: Secondary | ICD-10-CM | POA: Diagnosis not present

## 2020-06-09 ENCOUNTER — Telehealth (HOSPITAL_COMMUNITY): Payer: 59 | Admitting: Psychiatry

## 2020-06-18 ENCOUNTER — Ambulatory Visit (INDEPENDENT_AMBULATORY_CARE_PROVIDER_SITE_OTHER): Payer: 59 | Admitting: Psychology

## 2020-06-18 DIAGNOSIS — F411 Generalized anxiety disorder: Secondary | ICD-10-CM

## 2020-07-30 ENCOUNTER — Ambulatory Visit (INDEPENDENT_AMBULATORY_CARE_PROVIDER_SITE_OTHER): Payer: 59 | Admitting: Psychology

## 2020-07-30 DIAGNOSIS — F411 Generalized anxiety disorder: Secondary | ICD-10-CM

## 2020-09-10 ENCOUNTER — Ambulatory Visit: Payer: 59 | Admitting: Psychology

## 2020-10-06 ENCOUNTER — Ambulatory Visit (INDEPENDENT_AMBULATORY_CARE_PROVIDER_SITE_OTHER): Payer: 59 | Admitting: Psychology

## 2020-10-06 DIAGNOSIS — F411 Generalized anxiety disorder: Secondary | ICD-10-CM | POA: Diagnosis not present

## 2020-11-19 ENCOUNTER — Ambulatory Visit (INDEPENDENT_AMBULATORY_CARE_PROVIDER_SITE_OTHER): Payer: 59 | Admitting: Psychology

## 2020-11-19 DIAGNOSIS — F411 Generalized anxiety disorder: Secondary | ICD-10-CM

## 2020-12-07 ENCOUNTER — Other Ambulatory Visit: Payer: Self-pay | Admitting: Physician Assistant

## 2020-12-07 DIAGNOSIS — N289 Disorder of kidney and ureter, unspecified: Secondary | ICD-10-CM

## 2020-12-31 ENCOUNTER — Ambulatory Visit
Admission: RE | Admit: 2020-12-31 | Discharge: 2020-12-31 | Disposition: A | Discharge status: Home / Self Care | Payer: 59 | Source: Ambulatory Visit | Attending: Physician Assistant | Admitting: Physician Assistant

## 2020-12-31 ENCOUNTER — Other Ambulatory Visit: Payer: Self-pay

## 2020-12-31 ENCOUNTER — Ambulatory Visit (INDEPENDENT_AMBULATORY_CARE_PROVIDER_SITE_OTHER): Payer: 59 | Admitting: Psychology

## 2020-12-31 DIAGNOSIS — F411 Generalized anxiety disorder: Secondary | ICD-10-CM | POA: Diagnosis not present

## 2020-12-31 DIAGNOSIS — N289 Disorder of kidney and ureter, unspecified: Secondary | ICD-10-CM

## 2021-03-14 ENCOUNTER — Other Ambulatory Visit (HOSPITAL_COMMUNITY): Payer: Self-pay | Admitting: Psychiatry

## 2021-03-14 DIAGNOSIS — F3342 Major depressive disorder, recurrent, in full remission: Secondary | ICD-10-CM

## 2021-12-28 ENCOUNTER — Other Ambulatory Visit: Payer: Self-pay | Admitting: Physician Assistant

## 2021-12-28 DIAGNOSIS — Z1231 Encounter for screening mammogram for malignant neoplasm of breast: Secondary | ICD-10-CM

## 2022-01-27 ENCOUNTER — Ambulatory Visit
Admission: RE | Admit: 2022-01-27 | Discharge: 2022-01-27 | Disposition: A | Discharge status: Home / Self Care | Payer: BC Managed Care – PPO | Source: Ambulatory Visit | Attending: Physician Assistant | Admitting: Physician Assistant

## 2022-01-27 DIAGNOSIS — Z1231 Encounter for screening mammogram for malignant neoplasm of breast: Secondary | ICD-10-CM | POA: Diagnosis not present

## 2023-01-24 ENCOUNTER — Other Ambulatory Visit: Payer: Self-pay | Admitting: Physician Assistant

## 2023-01-24 DIAGNOSIS — Z1231 Encounter for screening mammogram for malignant neoplasm of breast: Secondary | ICD-10-CM

## 2023-02-01 ENCOUNTER — Ambulatory Visit
Admission: RE | Admit: 2023-02-01 | Discharge: 2023-02-01 | Disposition: A | Discharge status: Home / Self Care | Payer: 59 | Source: Ambulatory Visit | Attending: Physician Assistant | Admitting: Physician Assistant

## 2023-02-01 DIAGNOSIS — Z1231 Encounter for screening mammogram for malignant neoplasm of breast: Secondary | ICD-10-CM | POA: Insufficient documentation

## 2023-02-06 ENCOUNTER — Encounter: Payer: Self-pay | Admitting: *Deleted

## 2023-02-08 ENCOUNTER — Encounter: Payer: Self-pay | Admitting: *Deleted

## 2023-02-13 ENCOUNTER — Encounter: Payer: Self-pay | Admitting: *Deleted

## 2023-02-13 ENCOUNTER — Ambulatory Visit
Admission: RE | Admit: 2023-02-13 | Discharge: 2023-02-13 | Disposition: A | Discharge status: Home / Self Care | Payer: No Typology Code available for payment source | Attending: Gastroenterology | Admitting: Gastroenterology

## 2023-02-13 ENCOUNTER — Ambulatory Visit: Payer: No Typology Code available for payment source | Admitting: Anesthesiology

## 2023-02-13 ENCOUNTER — Encounter
Admission: RE | Disposition: A | Discharge status: Home / Self Care | Payer: Self-pay | Source: Home / Self Care | Attending: Gastroenterology

## 2023-02-13 DIAGNOSIS — F419 Anxiety disorder, unspecified: Secondary | ICD-10-CM | POA: Insufficient documentation

## 2023-02-13 DIAGNOSIS — F32A Depression, unspecified: Secondary | ICD-10-CM | POA: Insufficient documentation

## 2023-02-13 DIAGNOSIS — K640 First degree hemorrhoids: Secondary | ICD-10-CM | POA: Diagnosis not present

## 2023-02-13 DIAGNOSIS — D125 Benign neoplasm of sigmoid colon: Secondary | ICD-10-CM | POA: Insufficient documentation

## 2023-02-13 DIAGNOSIS — K5730 Diverticulosis of large intestine without perforation or abscess without bleeding: Secondary | ICD-10-CM | POA: Insufficient documentation

## 2023-02-13 DIAGNOSIS — R195 Other fecal abnormalities: Secondary | ICD-10-CM | POA: Insufficient documentation

## 2023-02-13 DIAGNOSIS — Z1211 Encounter for screening for malignant neoplasm of colon: Secondary | ICD-10-CM | POA: Insufficient documentation

## 2023-02-13 DIAGNOSIS — Z7984 Long term (current) use of oral hypoglycemic drugs: Secondary | ICD-10-CM | POA: Insufficient documentation

## 2023-02-13 DIAGNOSIS — I129 Hypertensive chronic kidney disease with stage 1 through stage 4 chronic kidney disease, or unspecified chronic kidney disease: Secondary | ICD-10-CM | POA: Insufficient documentation

## 2023-02-13 DIAGNOSIS — R7303 Prediabetes: Secondary | ICD-10-CM | POA: Diagnosis not present

## 2023-02-13 DIAGNOSIS — N189 Chronic kidney disease, unspecified: Secondary | ICD-10-CM | POA: Insufficient documentation

## 2023-02-13 HISTORY — DX: Prediabetes: R73.03

## 2023-02-13 HISTORY — PX: COLONOSCOPY WITH PROPOFOL: SHX5780

## 2023-02-13 HISTORY — PX: POLYPECTOMY: SHX5525

## 2023-02-13 LAB — GLUCOSE, CAPILLARY: Glucose-Capillary: 84 mg/dL (ref 70–99)

## 2023-02-13 SURGERY — COLONOSCOPY WITH PROPOFOL
Anesthesia: General

## 2023-02-13 MED ORDER — PROPOFOL 10 MG/ML IV BOLUS
INTRAVENOUS | Status: AC
  Filled 2023-02-13: qty 20

## 2023-02-13 MED ORDER — PROPOFOL 10 MG/ML IV BOLUS
INTRAVENOUS | Status: DC | PRN
  Administered 2023-02-13: 40 mg via INTRAVENOUS
  Administered 2023-02-13: 20 mg via INTRAVENOUS
  Administered 2023-02-13: 50 mg via INTRAVENOUS
  Administered 2023-02-13: 20 mg via INTRAVENOUS
  Administered 2023-02-13: 30 mg via INTRAVENOUS
  Administered 2023-02-13 (×7): 20 mg via INTRAVENOUS

## 2023-02-13 MED ORDER — LIDOCAINE HCL (PF) 2 % IJ SOLN
INTRAMUSCULAR | Status: DC | PRN
  Administered 2023-02-13: 40 mg via INTRADERMAL

## 2023-02-13 MED ORDER — SODIUM CHLORIDE 0.9 % IV SOLN: INTRAVENOUS | Status: DC

## 2023-02-13 NOTE — H&P (Signed)
Outpatient short stay form Pre-procedure 02/13/2023  Regis Bill, MD  Primary Physician: Patrice Paradise, MD  Reason for visit:  Positive cologuard  History of present illness:    57 y/o lady with history of obesity and hypertension here for colonoscopy due to positive cologuard. No blood thinners. No family history of GI malignancies. No significant abdominal surgeries.    Current Facility-Administered Medications:    0.9 %  sodium chloride infusion, , Intravenous, Continuous, Taeden Geller, Rossie Muskrat, MD, Last Rate: 20 mL/hr at 02/13/23 1046, Continued from Pre-op at 02/13/23 1046  Medications Prior to Admission  Medication Sig Dispense Refill Last Dose   buPROPion (WELLBUTRIN XL) 150 MG 24 hr tablet Take 1 tablet (150 mg total) by mouth every morning. 90 tablet 1 02/12/2023   Cholecalciferol (VITAMIN D3 PO) Take by mouth.   Past Week   losartan-hydrochlorothiazide (HYZAAR) 100-25 MG per tablet Take 1 tablet by mouth daily.   02/12/2023   Magnesium 200 MG TABS Take by mouth.   Past Week   metFORMIN (GLUCOPHAGE-XR) 500 MG 24 hr tablet TAKE 1 TABLET(500 MG) BY MOUTH DAILY WITH DINNER   02/12/2023   Semaglutide,0.25 or 0.5MG /DOS, (OZEMPIC, 0.25 OR 0.5 MG/DOSE,) 2 MG/1.5ML SOPN Inject into the skin.   01/31/2023   ALPRAZolam (XANAX) 0.25 MG tablet Take 0.25 mg by mouth every morning.   12/20/2022   multivitamin-iron-minerals-folic acid (CENTRUM) chewable tablet Chew 1 tablet by mouth daily.      phentermine 37.5 MG capsule Take 37.5 mg by mouth every morning.      Probiotic Product (SM ACIDOPHILUS) CAPS Take by mouth.        Allergies  Allergen Reactions   Sulfa Antibiotics Hives     Past Medical History:  Diagnosis Date   Anxiety    Hypertension    Pre-diabetes     Review of systems:  Otherwise negative.    Physical Exam  Gen: Alert, oriented. Appears stated age.  HEENT: PERRLA. Lungs: No respiratory distress CV: RRR Abd: soft, benign, no masses Ext: No  edema    Planned procedures: Proceed with colonoscopy. The patient understands the nature of the planned procedure, indications, risks, alternatives and potential complications including but not limited to bleeding, infection, perforation, damage to internal organs and possible oversedation/side effects from anesthesia. The patient agrees and gives consent to proceed.  Please refer to procedure notes for findings, recommendations and patient disposition/instructions.     Regis Bill, MD [facility]

## 2023-02-13 NOTE — Transfer of Care (Signed)
Immediate Anesthesia Transfer of Care Note  Patient: Zoe Koch  Procedure(s) Performed: COLONOSCOPY WITH PROPOFOL POLYPECTOMY  Patient Location: PACU and Endoscopy Unit  Anesthesia Type:MAC  Level of Consciousness: awake  Airway & Oxygen Therapy: Patient Spontanous Breathing  Post-op Assessment: Report given to RN and Post -op Vital signs reviewed and stable  Post vital signs: Reviewed and stable  Last Vitals:  Vitals Value Taken Time  BP 98/62 02/13/23 1126  Temp 35.9 C 02/13/23 1126  Pulse 79 02/13/23 1127  Resp 16 02/13/23 1127  SpO2 95 % 02/13/23 1127  Vitals shown include unvalidated device data.  Last Pain:  Vitals:   02/13/23 1126  TempSrc: Temporal  PainSc: 0-No pain         Complications: No notable events documented.

## 2023-02-13 NOTE — Op Note (Signed)
[facility] Patient Name: Zoe Koch Procedure Date: 02/13/2023 10:42 AM MRN: [id number] Account #: [id number] Date of Birth: November 08, 1965 Admit Type: Outpatient Age: 57 Room: [facility] ENDO ROOM 3 Gender: Female Note Status: Finalized Instrument Name: Nelda Marseille [id number] Procedure:             Colonoscopy Indications:           Positive Cologuard test Providers:             Eather Colas MD, MD Referring MD:          Eather Colas MD, MD (Referring MD), Marilynne Halsted, MD (Referring MD) Medicines:             Monitored Anesthesia Care Complications:         No immediate complications. Estimated blood loss:                         Minimal. Procedure:             Pre-Anesthesia Assessment:                        - Prior to the procedure, a History and Physical was                         performed, and patient medications and allergies were                         reviewed. The patient is competent. The risks and                         benefits of the procedure and the sedation options and                         risks were discussed with the patient. All questions                         were answered and informed consent was obtained.                         Patient identification and proposed procedure were                         verified by the physician, the nurse, the                         anesthesiologist, the anesthetist and the technician                         in the endoscopy suite. Mental Status Examination:                         alert and oriented. Airway Examination: normal                         oropharyngeal airway and neck mobility. Respiratory  Examination: clear to auscultation. CV Examination:                         normal. Prophylactic Antibiotics: The patient does not                         require prophylactic antibiotics. Prior                          Anticoagulants: The patient has taken no anticoagulant                         or antiplatelet agents. ASA Grade Assessment: III - A                         patient with severe systemic disease. After reviewing                         the risks and benefits, the patient was deemed in                         satisfactory condition to undergo the procedure. The                         anesthesia plan was to use monitored anesthesia care                         (MAC). Immediately prior to administration of                         medications, the patient was re-assessed for adequacy                         to receive sedatives. The heart rate, respiratory                         rate, oxygen saturations, blood pressure, adequacy of                         pulmonary ventilation, and response to care were                         monitored throughout the procedure. The physical                         status of the patient was re-assessed after the                         procedure.                        After obtaining informed consent, the colonoscope was                         passed under direct vision. Throughout the procedure,                         the patient's blood pressure, pulse, and oxygen  saturations were monitored continuously. The                         Colonoscope was introduced through the anus and                         advanced to the the terminal ileum. The colonoscopy                         was performed without difficulty. The patient                         tolerated the procedure well. The quality of the bowel                         preparation was adequate to identify polyps. The                         terminal ileum, ileocecal valve, appendiceal orifice,                         and rectum were photographed. Findings:      The perianal and digital rectal examinations were normal.      The terminal ileum appeared normal.      Multiple  small-mouthed diverticula were found in the sigmoid colon and       descending colon.      A 10 mm polyp was found in the sigmoid colon. The polyp was       semi-pedunculated. The polyp was removed with a hot snare. Resection and       retrieval were complete. Estimated blood loss: none.      A 2 mm polyp was found in the sigmoid colon. The polyp was sessile. The       polyp was removed with a cold snare. Resection and retrieval were       complete. Estimated blood loss was minimal.      Internal hemorrhoids were found during retroflexion. The hemorrhoids       were Grade I (internal hemorrhoids that do not prolapse).      The exam was otherwise without abnormality on direct and retroflexion       views. Impression:            - The examined portion of the ileum was normal.                        - Diverticulosis in the sigmoid colon and in the                         descending colon.                        - One 10 mm polyp in the sigmoid colon, removed with a                         hot snare. Resected and retrieved.                        - One 2 mm polyp in the sigmoid colon, removed with a  cold snare. Resected and retrieved.                        - Internal hemorrhoids.                        - The examination was otherwise normal on direct and                         retroflexion views. Recommendation:        - Discharge patient to home.                        - Resume previous diet.                        - Continue present medications.                        - Await pathology results.                        - Repeat colonoscopy in 3 years for surveillance.                        - Return to referring physician as previously                         scheduled. Procedure Code(s):     --- Professional ---                        [phone], Colonoscopy, flexible; with removal of                         tumor(s), polyp(s), or other lesion(s) by snare                          technique Diagnosis Code(s):     --- Professional ---                        K64.0, First degree hemorrhoids                        D12.5, Benign neoplasm of sigmoid colon                        R19.5, Other fecal abnormalities                        K57.30, Diverticulosis of large intestine without                         perforation or abscess without bleeding CPT copyright 2022 [employer]. All rights reserved. The codes documented in this report are preliminary and upon coder review may  be revised to meet current compliance requirements. Eather Colas MD, MD 02/13/2023 11:28:49 AM Number of Addenda: 0 Note Initiated On: 02/13/2023 10:42 AM Scope Withdrawal Time: 0 hours 12 minutes 51 seconds  Total Procedure Duration: 0 hours 19 minutes 29 seconds  Estimated Blood Loss:  Estimated blood loss was minimal.      [facility]

## 2023-02-13 NOTE — Interval H&P Note (Signed)
History and Physical Interval Note:  02/13/2023 10:56 AM  Zoe Koch  has presented today for surgery, with the diagnosis of (+) COLOGUARD.  The various methods of treatment have been discussed with the patient and family. After consideration of risks, benefits and other options for treatment, the patient has consented to  Procedure(s): COLONOSCOPY WITH PROPOFOL (N/A) as a surgical intervention.  The patient's history has been reviewed, patient examined, no change in status, stable for surgery.  I have reviewed the patient's chart and labs.  Questions were answered to the patient's satisfaction.     Regis Bill  Ok to proceed with colonoscopy

## 2023-02-13 NOTE — Anesthesia Postprocedure Evaluation (Signed)
Anesthesia Post Note  Patient: CHANETTA SPEHAR  Procedure(s) Performed: COLONOSCOPY WITH PROPOFOL POLYPECTOMY  Patient location during evaluation: Endoscopy Anesthesia Type: General Level of consciousness: awake and alert Pain management: pain level controlled Vital Signs Assessment: post-procedure vital signs reviewed and stable Respiratory status: spontaneous breathing, nonlabored ventilation, respiratory function stable and patient connected to nasal cannula oxygen Cardiovascular status: blood pressure returned to baseline and stable Postop Assessment: no apparent nausea or vomiting Anesthetic complications: no   No notable events documented.   Last Vitals:  Vitals:   02/13/23 1126 02/13/23 1136  BP: 98/62 101/66  Pulse: 81   Resp: 17   Temp: (!) 35.9 C   SpO2: 95% 97%    Last Pain:  Vitals:   02/13/23 1136  TempSrc:   PainSc: 0-No pain                 Louie Boston

## 2023-02-13 NOTE — Anesthesia Preprocedure Evaluation (Signed)
Anesthesia Evaluation  Patient identified by MRN, date of birth, ID band Patient awake    Reviewed: Allergy & Precautions, NPO status , Patient's Chart, lab work & pertinent test results  History of Anesthesia Complications Negative for: history of anesthetic complications  Airway Mallampati: III  TM Distance: >3 FB Neck ROM: full    Dental no notable dental hx.    Pulmonary neg pulmonary ROS   Pulmonary exam normal        Cardiovascular hypertension, On Medications negative cardio ROS Normal cardiovascular exam     Neuro/Psych  PSYCHIATRIC DISORDERS Anxiety Depression    negative neurological ROS     GI/Hepatic negative GI ROS, Neg liver ROS,,,  Endo/Other    Morbid obesity  Renal/GU Renal disease (CKD)  negative genitourinary   Musculoskeletal   Abdominal   Peds  Hematology negative hematology ROS (+)   Anesthesia Other Findings Past Medical History: No date: Anxiety No date: Hypertension No date: Pre-diabetes  Past Surgical History: No date: EYE SURGERY     Reproductive/Obstetrics negative OB ROS                              Anesthesia Physical Anesthesia Plan  ASA: 3  Anesthesia Plan: General   Post-op Pain Management: Minimal or no pain anticipated   Induction: Intravenous  PONV Risk Score and Plan: Propofol infusion and TIVA  Airway Management Planned: Natural Airway and Nasal Cannula  Additional Equipment:   Intra-op Plan:   Post-operative Plan:   Informed Consent: I have reviewed the patients History and Physical, chart, labs and discussed the procedure including the risks, benefits and alternatives for the proposed anesthesia with the patient or authorized representative who has indicated his/her understanding and acceptance.     Dental Advisory Given  Plan Discussed with: Anesthesiologist, CRNA and Surgeon  Anesthesia Plan Comments: (Patient  consented for risks of anesthesia including but not limited to:  - adverse reactions to medications - risk of airway placement if required - damage to eyes, teeth, lips or other oral mucosa - nerve damage due to positioning  - sore throat or hoarseness - Damage to heart, brain, nerves, lungs, other parts of body or loss of life  Patient voiced understanding.)         Anesthesia Quick Evaluation

## 2023-02-14 ENCOUNTER — Encounter: Payer: Self-pay | Admitting: Gastroenterology

## 2024-01-22 ENCOUNTER — Other Ambulatory Visit: Payer: Self-pay | Admitting: Medical Genetics

## 2024-01-27 ENCOUNTER — Other Ambulatory Visit
Admission: RE | Admit: 2024-01-27 | Discharge: 2024-01-27 | Disposition: A | Discharge status: Home / Self Care | Payer: Self-pay | Source: Ambulatory Visit | Attending: Medical Genetics | Admitting: Medical Genetics

## 2024-01-29 ENCOUNTER — Other Ambulatory Visit: Payer: Self-pay | Admitting: Physician Assistant

## 2024-01-29 DIAGNOSIS — Z1231 Encounter for screening mammogram for malignant neoplasm of breast: Secondary | ICD-10-CM

## 2024-02-02 ENCOUNTER — Ambulatory Visit
Admission: RE | Admit: 2024-02-02 | Discharge: 2024-02-02 | Disposition: A | Discharge status: Home / Self Care | Source: Ambulatory Visit | Attending: Physician Assistant | Admitting: Physician Assistant

## 2024-02-02 ENCOUNTER — Inpatient Hospital Stay: Admission: RE | Admit: 2024-02-02 | Source: Ambulatory Visit

## 2024-02-02 DIAGNOSIS — Z1231 Encounter for screening mammogram for malignant neoplasm of breast: Secondary | ICD-10-CM | POA: Insufficient documentation

## 2024-02-06 LAB — GENECONNECT MOLECULAR SCREEN: Genetic Analysis Overall Interpretation: NEGATIVE

## 2024-03-23 IMAGING — MG MM DIGITAL SCREENING BILAT W/ TOMO AND CAD
6 of 12 series · 6 of 36 positions shown · non-contrast
Comparison: Previous exam(s).

CLINICAL DATA: Screening.

EXAM:
DIGITAL SCREENING BILATERAL MAMMOGRAM WITH TOMOSYNTHESIS AND CAD
TECHNIQUE: Bilateral screening digital craniocaudal and mediolateral oblique
mammograms were obtained. Bilateral screening digital breast
tomosynthesis was performed. The images were evaluated with
computer-aided detection.

[L CC synth-2D (1 of 2)]
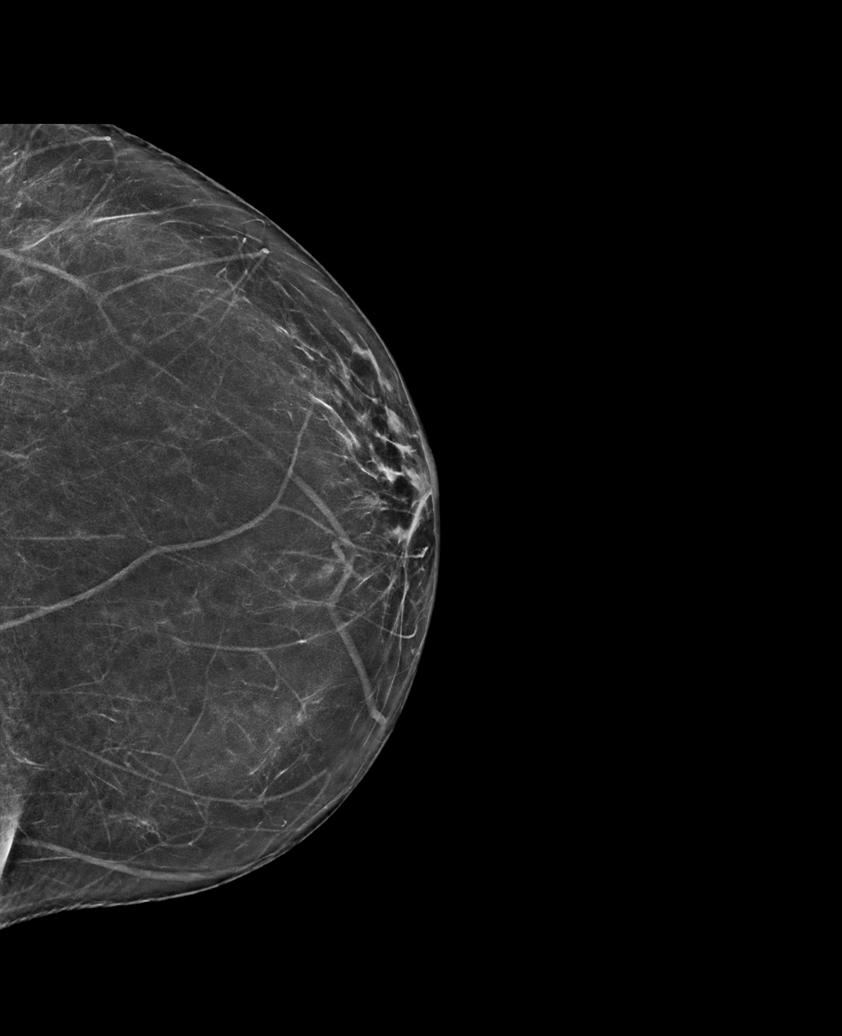

[R CC synth-2D (1 of 2)]
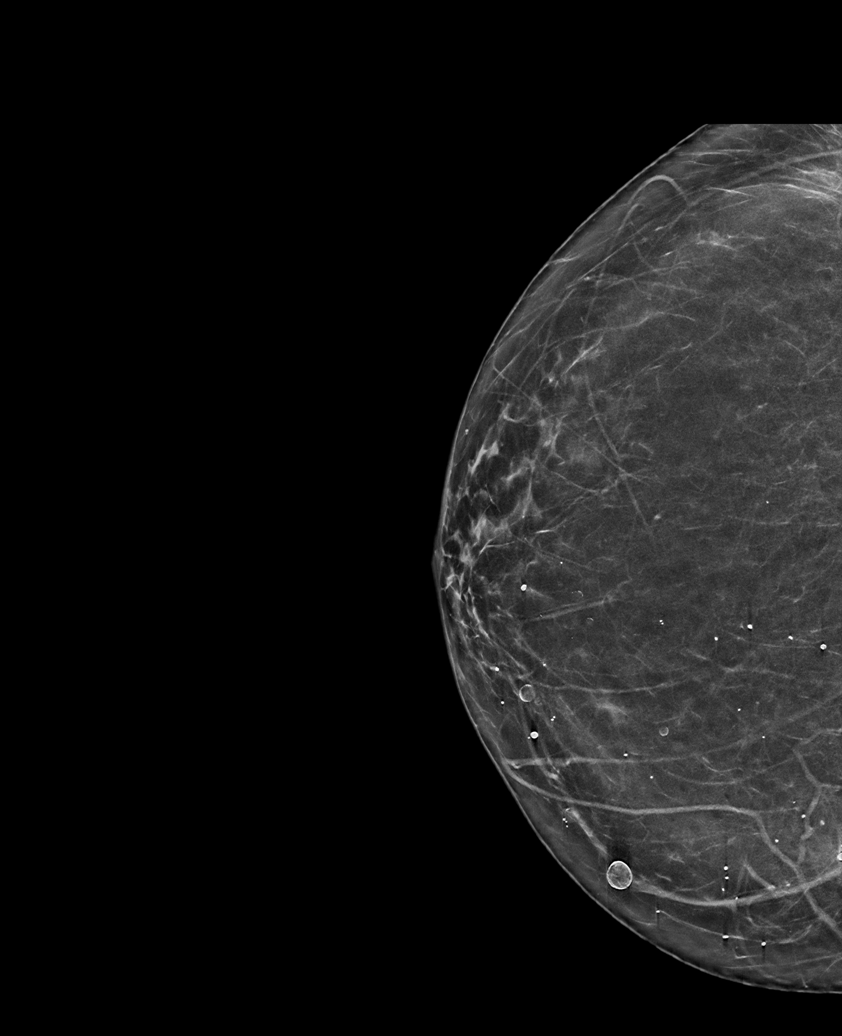

[R CC synth-2D (2 of 2)]
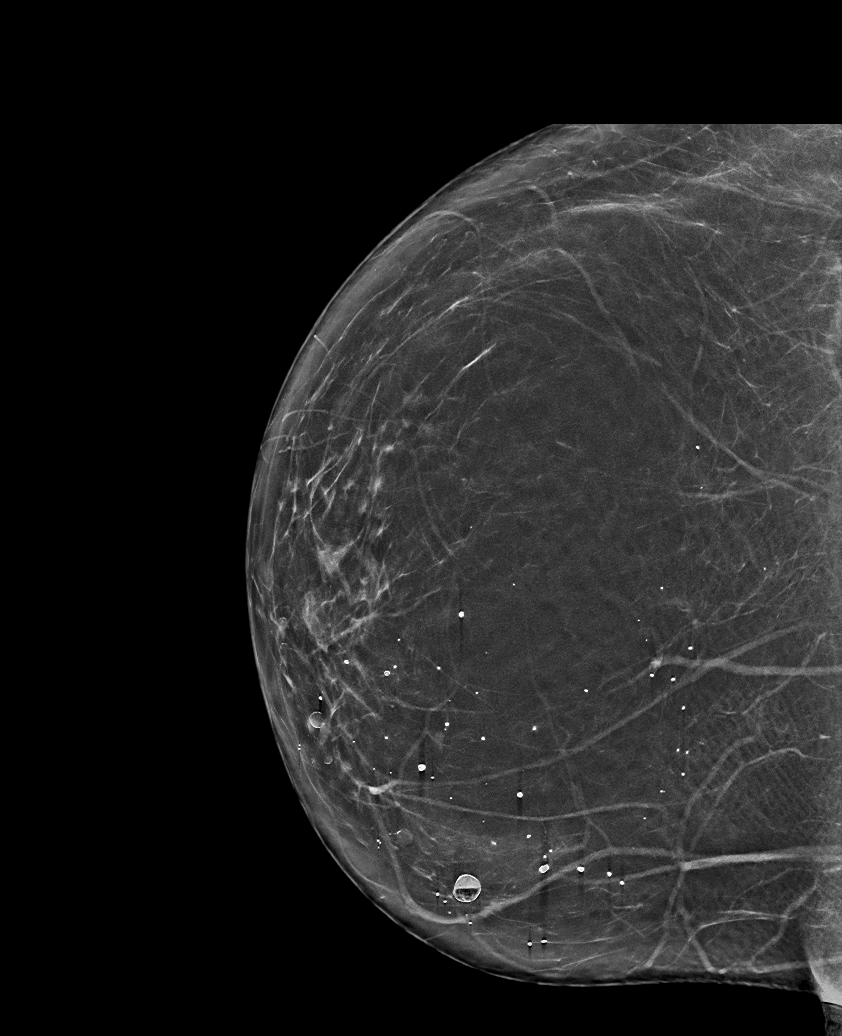

[L CC synth-2D (2 of 2)]
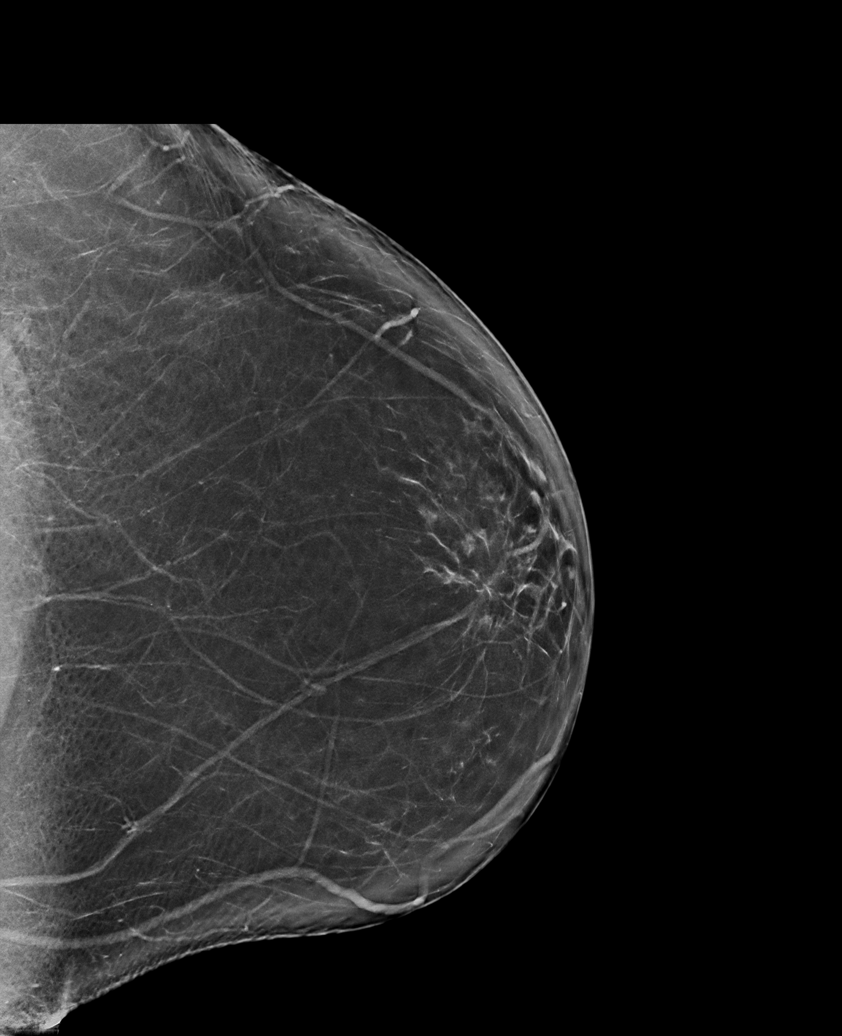

[L MLO synth-2D]
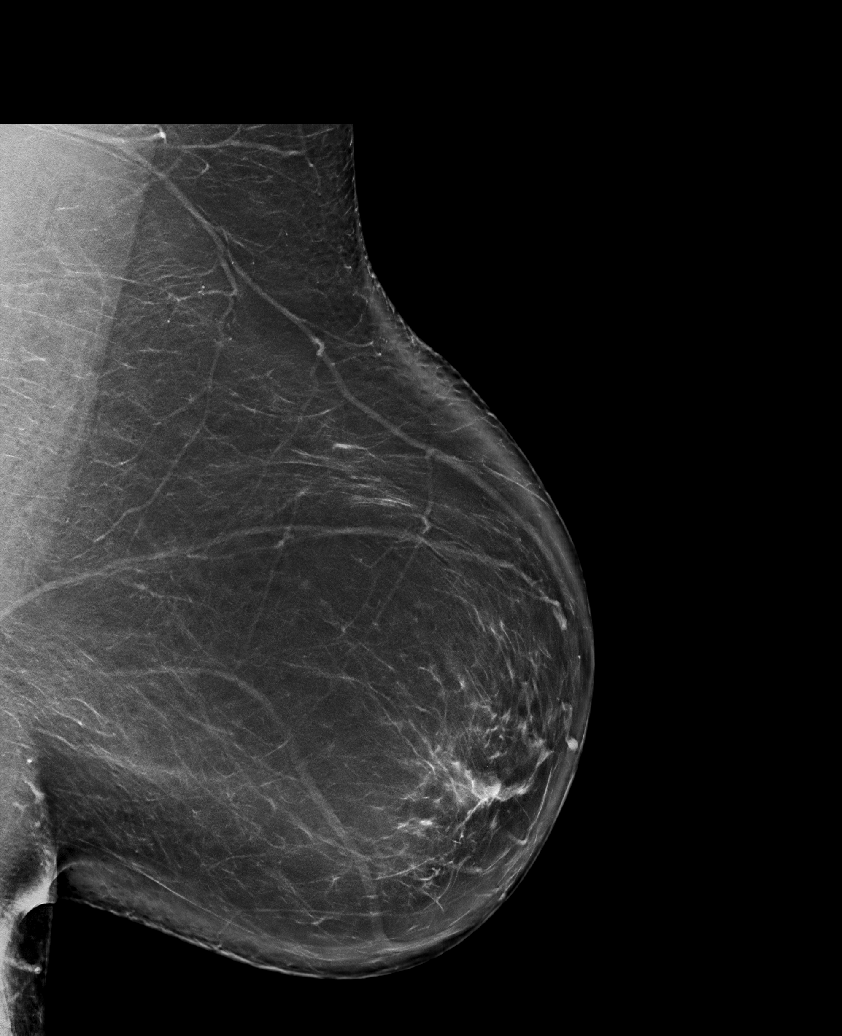

[R MLO synth-2D]
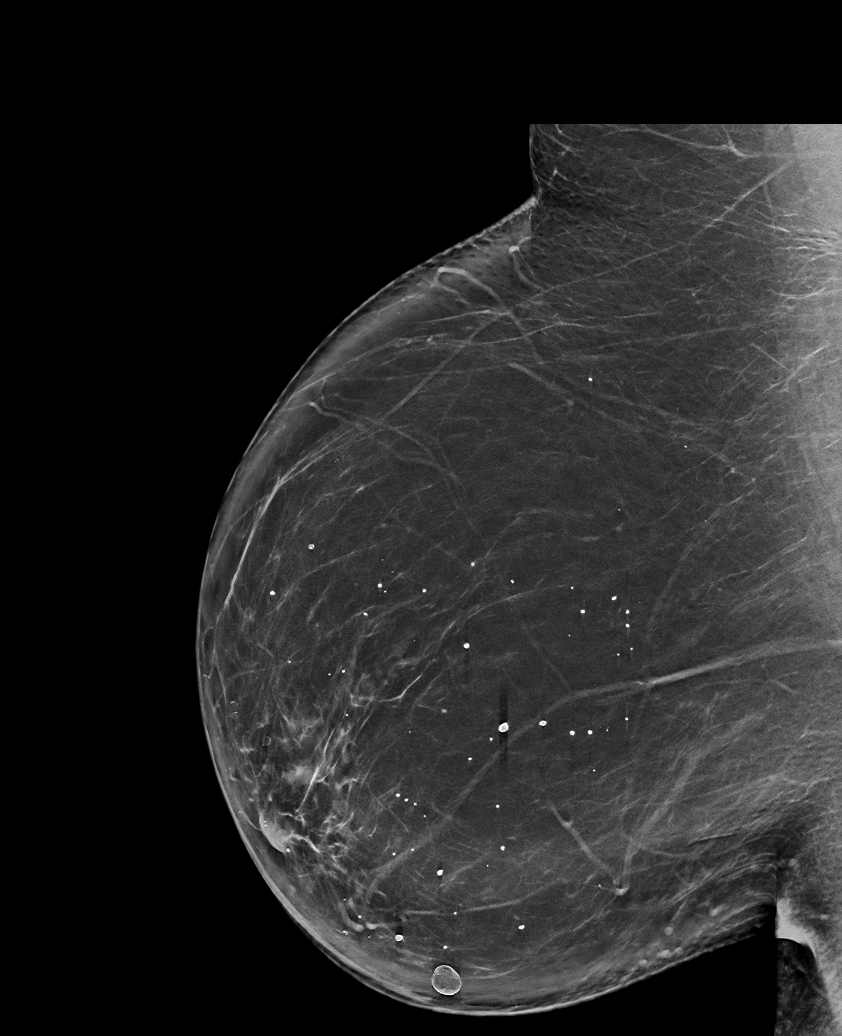

[6 of 36 positions shown; findings below may reference images not displayed]

ACR Breast Density Category b: There are scattered areas of
fibroglandular density.
FINDINGS: There are no findings suspicious for malignancy.
IMPRESSION: No mammographic evidence of malignancy. A result letter of this
screening mammogram will be mailed directly to the patient.

RECOMMENDATION:
Screening mammogram in one year. (Code:51-O-LD2)

BI-RADS CATEGORY  1: Negative.
